# Patient Record
Sex: Male | Born: 1950 | Race: Black or African American | Hispanic: No | Marital: Married | Smoking: Never smoker
Health system: Southern US, Community
[De-identification: ages and names within clinical notes are randomized; demographics above are authoritative.]

## PROBLEM LIST (undated history)

## (undated) DIAGNOSIS — E119 Type 2 diabetes mellitus without complications: Secondary | ICD-10-CM

## (undated) DIAGNOSIS — I10 Essential (primary) hypertension: Secondary | ICD-10-CM

---

## 2017-04-30 ENCOUNTER — Encounter (HOSPITAL_COMMUNITY): Payer: Self-pay

## 2017-04-30 ENCOUNTER — Emergency Department (HOSPITAL_COMMUNITY)
Admission: EM | Admit: 2017-04-30 | Discharge: 2017-04-30 | Disposition: A | Discharge status: Home / Self Care | Payer: Medicare Other | Attending: Emergency Medicine | Admitting: Emergency Medicine

## 2017-04-30 DIAGNOSIS — Y929 Unspecified place or not applicable: Secondary | ICD-10-CM | POA: Diagnosis not present

## 2017-04-30 DIAGNOSIS — Z7984 Long term (current) use of oral hypoglycemic drugs: Secondary | ICD-10-CM | POA: Insufficient documentation

## 2017-04-30 DIAGNOSIS — E119 Type 2 diabetes mellitus without complications: Secondary | ICD-10-CM | POA: Diagnosis not present

## 2017-04-30 DIAGNOSIS — Y939 Activity, unspecified: Secondary | ICD-10-CM | POA: Diagnosis not present

## 2017-04-30 DIAGNOSIS — Z79899 Other long term (current) drug therapy: Secondary | ICD-10-CM | POA: Insufficient documentation

## 2017-04-30 DIAGNOSIS — W57XXXA Bitten or stung by nonvenomous insect and other nonvenomous arthropods, initial encounter: Secondary | ICD-10-CM | POA: Insufficient documentation

## 2017-04-30 DIAGNOSIS — Y999 Unspecified external cause status: Secondary | ICD-10-CM | POA: Insufficient documentation

## 2017-04-30 DIAGNOSIS — S30860A Insect bite (nonvenomous) of lower back and pelvis, initial encounter: Secondary | ICD-10-CM | POA: Diagnosis present

## 2017-04-30 DIAGNOSIS — I10 Essential (primary) hypertension: Secondary | ICD-10-CM | POA: Diagnosis not present

## 2017-04-30 HISTORY — DX: Type 2 diabetes mellitus without complications: E11.9

## 2017-04-30 HISTORY — DX: Essential (primary) hypertension: I10

## 2017-04-30 MED ORDER — DOXYCYCLINE HYCLATE 100 MG PO TABS
200.0000 mg | ORAL_TABLET | Freq: Once | ORAL | Status: AC
  Administered 2017-04-30: 200 mg via ORAL
  Filled 2017-04-30: qty 2

## 2017-04-30 NOTE — ED Triage Notes (Signed)
Pt thinks he has a tick or some insects on his genitals He states that he has random dogs in his yard and thinks that's where they came form

## 2017-04-30 NOTE — ED Notes (Signed)
C/o of two ticks in groin area.

## 2017-04-30 NOTE — ED Provider Notes (Signed)
[facility]-EMERGENCY DEPT Provider Note: Lowella Dell, MD, FACEP  CSN: [id number] MRN: [id number] ARRIVAL: 04/30/17 at 0137 ROOM: WA07/WA07   CHIEF COMPLAINT  Insect Bite   HISTORY OF PRESENT ILLNESS  04/30/17 5:42 AM John Mora is a 66 y.o. male who noticed to bumps on his left groin yesterday while in the shower. He took a closer look and saw that they appeared to be ticks. He applied salt solution with subsequent removal of the takes. He is here now with a mild burning sensation at the site. There is no associated swelling or erythema.    Past Medical History:  Diagnosis Date  . Diabetes mellitus without complication (HCC)   . Hypertension     History reviewed. No pertinent surgical history.  History reviewed. No pertinent family history.  Social History  Substance Use Topics  . Smoking status: Never Smoker  . Smokeless tobacco: Never Used  . Alcohol use No    Prior to Admission medications   Medication Sig Start Date End Date Taking? Authorizing Provider  albuterol (PROVENTIL HFA;VENTOLIN HFA) 108 (90 Base) MCG/ACT inhaler Inhale 2 puffs into the lungs every 6 (six) hours as needed for wheezing or shortness of breath.   Yes [provider]  benazepril (LOTENSIN) 40 MG tablet Take 40 mg by mouth 2 (two) times daily. 04/02/17  Yes [provider]  cholecalciferol (VITAMIN D) 1000 units tablet Take 1,000 Units by mouth daily.   Yes [provider]  furosemide (LASIX) 20 MG tablet Take 20 mg by mouth 2 (two) times daily. 03/07/17  Yes [provider]  glipiZIDE (GLUCOTROL) 5 MG tablet Take 5 mg by mouth 2 (two) times daily.  03/26/17  Yes [provider]  hydroxypropyl methylcellulose / hypromellose (ISOPTO TEARS / GONIOVISC) 2.5 % ophthalmic solution Place 1 drop into both eyes 3 (three) times daily as needed for dry eyes.   Yes [provider]  JANUVIA 100 MG tablet Take 100 mg by mouth daily. 03/15/17  Yes [provider]  LEVEMIR FLEXTOUCH 100 UNIT/ML Pen INJECT 12 UNITS UNDER THE SKIN AT BEDTIME. 02/28/17  Yes [provider]  metFORMIN (GLUCOPHAGE) 500 MG tablet Take 500 mg by mouth 2 (two) times daily with a meal.   Yes [provider]  Omega-3 Fatty Acids (FISH OIL) 1000 MG CAPS Take 1,000 mg by mouth daily.   Yes [provider]  oxyCODONE-acetaminophen (PERCOCET/ROXICET) 5-325 MG tablet TAKE 1 TABLET BY MOUTH FOUR TIMES A DAY AS NEEDED FOR PAIN 04/23/17  Yes [provider]  spironolactone (ALDACTONE) 25 MG tablet Take 25 mg by mouth daily.  04/26/17  Yes [provider]    Allergies Patient has no known allergies.   REVIEW OF SYSTEMS  Negative except as noted here or in the History of Present Illness.   PHYSICAL EXAMINATION  Initial Vital Signs Blood pressure (!) 174/97, pulse 89, temperature 97.7 F (36.5 C), temperature source Oral, resp. rate 16, height  (1.854 m), weight 112.5 kg (248 lb), SpO2 98 %.  Examination General: Well-developed, well-nourished male in no acute distress; appearance consistent with age of record HENT: normocephalic; atraumatic Eyes: normal appearance Neck: supple Heart: regular rate and rhythm Lungs: clear to auscultation bilaterally Abdomen: soft; nondistended; nontender; bowel sounds present GU: Tanner 5 male, circumcised; mild left groin tenderness without lymphadenopathy, swelling, erythema or warmth Extremities: No deformity; full range of motion Neurologic: Awake, alert and oriented; motor function intact in all extremities and symmetric; no  facial droop Skin: Warm and dry Psychiatric: Normal mood and affect   RESULTS  Summary of this visit's results, reviewed by myself:   EKG Interpretation  Date/Time:    Ventricular Rate:    PR Interval:    QRS Duration:   QT Interval:    QTC Calculation:   R Axis:     Text Interpretation:        Laboratory Studies: No results found for this or  any previous visit (from the past 24 hour(s)). Imaging Studies: No results found.  ED COURSE  Nursing notes and initial vitals signs, including pulse oximetry, reviewed.  Vitals:   04/30/17 0237 04/30/17 0351 04/30/17 0548  BP: (!) 174/97  (!) 175/98  Pulse: 89  90  Resp: 16  18  Temp: 97.7 F (36.5 C)  97.8 F (36.6 C)  TempSrc: Oral  Oral  SpO2: 97% 98% 99%  Weight: 112.5 kg (248 lb)    Height:  (1.854 m)     Patient given prophylaxis dose of doxycycline 200 milligrams orally.  PROCEDURES    ED DIAGNOSES     ICD-10-CM   1. Tick bite, initial encounter W57.Neldon Newport, MD 04/30/17 [phone]

## 2017-04-30 NOTE — ED Notes (Signed)
Bed: WA07 Expected date:  Expected time:  Means of arrival:  Comments: 

## 2017-08-31 ENCOUNTER — Emergency Department (HOSPITAL_COMMUNITY)
Admission: EM | Admit: 2017-08-31 | Discharge: 2017-09-01 | Disposition: A | Discharge status: Home / Self Care | Payer: Medicare Other | Attending: Emergency Medicine | Admitting: Emergency Medicine

## 2017-08-31 ENCOUNTER — Emergency Department (HOSPITAL_COMMUNITY): Payer: Medicare Other

## 2017-08-31 DIAGNOSIS — I10 Essential (primary) hypertension: Secondary | ICD-10-CM | POA: Insufficient documentation

## 2017-08-31 DIAGNOSIS — Z79899 Other long term (current) drug therapy: Secondary | ICD-10-CM | POA: Diagnosis not present

## 2017-08-31 DIAGNOSIS — Z7984 Long term (current) use of oral hypoglycemic drugs: Secondary | ICD-10-CM | POA: Insufficient documentation

## 2017-08-31 DIAGNOSIS — J101 Influenza due to other identified influenza virus with other respiratory manifestations: Secondary | ICD-10-CM

## 2017-08-31 DIAGNOSIS — E119 Type 2 diabetes mellitus without complications: Secondary | ICD-10-CM | POA: Insufficient documentation

## 2017-08-31 DIAGNOSIS — R509 Fever, unspecified: Secondary | ICD-10-CM | POA: Diagnosis present

## 2017-08-31 LAB — CBG MONITORING, ED: GLUCOSE-CAPILLARY: 170 mg/dL — AB (ref 65–99)

## 2017-08-31 MED ORDER — ACETAMINOPHEN 325 MG PO TABS
650.0000 mg | ORAL_TABLET | Freq: Once | ORAL | Status: AC | PRN
  Administered 2017-08-31: 650 mg via ORAL
  Filled 2017-08-31: qty 2

## 2017-08-31 MED ORDER — ALBUTEROL SULFATE (2.5 MG/3ML) 0.083% IN NEBU
5.0000 mg | INHALATION_SOLUTION | Freq: Once | RESPIRATORY_TRACT | Status: AC
  Administered 2017-09-01: 5 mg via RESPIRATORY_TRACT
  Filled 2017-08-31: qty 6

## 2017-08-31 NOTE — ED Notes (Signed)
Bed: [id number] Expected date:  Expected time:  Means of arrival:  Comments: EMS 67 yo male flu symptoms

## 2017-08-31 NOTE — ED Triage Notes (Signed)
Patient BIB EMS for flulike symptoms for past few days. Patient diagnosed with the flu today, no N/V. Patient febrile and has generalized weakness. 02 sat 90% on RA

## 2017-08-31 NOTE — ED Provider Notes (Signed)
TIME SEEN: 11:30 PM  CHIEF COMPLAINT: Flulike symptoms  HPI: Patient is a John Mora with history of hypertension, diabetes, hyperlipidemia, cardiomyopathy who presents to the emergency department with 3 days of fever, productive cough, body aches.  Was seen at urgent care today and told that he had a "slightly positive" flu swab.  Was not started on Tamiflu.  Family was concerned because he had an episode today where he was very weak and unable to answer questions or follow commands for them for about 30 minutes.  States that this was about 6 PM.  He states he remembers this episode but states he just felt so bad he could not do anything.  He denies true a aphasia, numbness, focal weakness, headache, neck pain or neck stiffness.  Was given and azithromycin from urgent care for possible pneumonia as well.  No other new medications.  No chest pain or shortness of breath.  ROS: See HPI Constitutional:  fever  Eyes: no drainage  ENT:  runny nose   Cardiovascular:  no chest pain  Resp: no SOB  GI: no vomiting GU: no dysuria Integumentary: no rash  Allergy: no hives  Musculoskeletal: no leg swelling  Neurological: no slurred speech ROS otherwise negative  PAST MEDICAL HISTORY/PAST SURGICAL HISTORY:  Past Medical History:  Diagnosis Date  . Diabetes mellitus without complication (HCC)   . Hypertension     MEDICATIONS:  Prior to Admission medications   Medication Sig Start Date End Date Taking? Authorizing Provider  allopurinol (ZYLOPRIM) 100 MG tablet Take 200 mg by mouth daily.   Yes [provider]  aspirin EC 81 MG tablet Take 81 mg by mouth daily.   Yes [provider]  azithromycin (ZITHROMAX) 250 MG tablet Take 250 mg by mouth once.  08/31/17  Yes [provider]  benazepril (LOTENSIN) 40 MG tablet Take 40 mg by mouth 2 (two) times daily. 04/02/17  Yes [provider]  cholecalciferol (VITAMIN D) 1000 units tablet Take 1,000 Units by mouth  daily.   Yes [provider]  furosemide (LASIX) 20 MG tablet Take 60 mg by mouth 2 (two) times daily.  03/07/17  Yes [provider]  glipiZIDE (GLUCOTROL) 5 MG tablet Take 15 mg by mouth 2 (two) times daily.  03/26/17  Yes [provider]  JANUVIA 100 MG tablet Take 100 mg by mouth daily. 03/15/17  Yes [provider]  LEVEMIR FLEXTOUCH 100 UNIT/ML Pen INJECT 12 UNITS UNDER THE SKIN AT BEDTIME. 02/28/17  Yes [provider]  metFORMIN (GLUCOPHAGE) 850 MG tablet Take 850 mg by mouth 3 (three) times daily.   Yes [provider]  metoprolol tartrate (LOPRESSOR) 100 MG tablet Take 100 mg by mouth 2 (two) times daily.   Yes [provider]  Omega-3 Fatty Acids (FISH OIL) 1000 MG CAPS Take 1,000 mg by mouth daily.   Yes [provider]  ONGLYZA 5 MG TABS tablet Take 5 mg by mouth daily.  08/24/17  Yes [provider]  oxyCODONE-acetaminophen (PERCOCET/ROXICET) 5-325 MG tablet TAKE 1 TABLET BY MOUTH FOUR TIMES A DAY AS NEEDED FOR PAIN 04/23/17  Yes [provider]  sennosides-docusate sodium (SENOKOT-S) 8.6-50 MG tablet Take 2 tablets by mouth 2 (two) times daily.   Yes [provider]  spironolactone (ALDACTONE) 25 MG tablet Take 25 mg by mouth daily.  04/26/17  Yes [provider]  terazosin (HYTRIN) 10 MG capsule Take 10 mg by mouth at bedtime.   Yes [provider]  albuterol (PROVENTIL HFA;VENTOLIN HFA) 108 (90 Base) MCG/ACT inhaler Inhale 2 puffs into the lungs every 6 (six) hours as needed for wheezing or shortness of breath.    [provider]  benzonatate (TESSALON) 100 MG capsule Take 100 mg by mouth 3 (three) times daily as needed for cough.  08/31/17   [provider]  hydroxypropyl methylcellulose / hypromellose (ISOPTO TEARS / GONIOVISC) 2.5 % ophthalmic solution Place 1 drop into both eyes 3 (three) times daily as needed for dry eyes.    [provider]   metFORMIN (GLUCOPHAGE) 500 MG tablet Take 500 mg by mouth 2 (two) times daily with a meal.    [provider]  mupirocin ointment (BACTROBAN) 2 % APPLY TO AFFECTED AREA EVERY DAY AS NEEDED 06/13/17   [provider]    ALLERGIES:  No Known Allergies  SOCIAL HISTORY:  Social History   Tobacco Use  . Smoking status: Never Smoker  . Smokeless tobacco: Never Used  Substance Use Topics  . Alcohol use: No    FAMILY HISTORY: No family history on file.  EXAM: BP (!) 141/77   Pulse 96   Temp (!) 102.9 F (39.4 C) (Oral)   Resp 18   SpO2 98%  CONSTITUTIONAL: Alert and oriented x3 and responds appropriately to questions. Well-appearing; well-nourished, obese, febrile but nontoxic HEAD: Normocephalic EYES: Conjunctivae clear, pupils appear equal, EOMI ENT: normal nose; moist mucous membranes; No pharyngeal erythema or petechiae, no tonsillar hypertrophy or exudate, no uvular deviation, no unilateral swelling, no trismus or drooling, no muffled voice, normal phonation, no stridor, no dental caries present, no drainable dental abscess noted, no Ludwig's angina, tongue sits flat in the bottom of the mouth, no angioedema, no facial erythema or warmth, no facial swelling; no pain with movement of the neck. NECK: Supple, no meningismus, no nuchal rigidity, no LAD  CARD: RRR; S1 and S2 appreciated; no murmurs, no clicks, no rubs, no gallops RESP: Normal chest excursion without splinting or tachypnea; breath sounds clear and equal bilaterally; no wheezes, no rhonchi, no rales, no hypoxia or respiratory distress, speaking full sentences ABD/GI: Normal bowel sounds; non-distended; soft, non-tender, no rebound, no guarding, no peritoneal signs, no hepatosplenomegaly BACK:  The back appears normal and is non-tender to palpation, there is no CVA tenderness EXT: Normal ROM in all joints; non-tender to palpation; no edema; normal capillary refill; no cyanosis, no calf tenderness or  swelling    SKIN: Normal color for age and race; warm; no rash NEURO: Moves all extremities equally, no pronator drift, sensation to light touch intact diffusely, cranial nerves II through XII intact, normal speech PSYCH: The patient's mood and manner are appropriate. Grooming and personal hygiene are appropriate.  MEDICAL DECISION MAKING: Patient here with episode of generalized weakness and not responding or talking to family.  He states he was awakened during this episode and remembers everything that happened but just states he felt so bad that he could not respond or interact.  Does not to like a seizure or syncope.  I doubt that this was a stroke.  He is neurologically intact now.  Will check labs, urine today.  We will also obtain flu swab.  Chest x-ray shows no infiltrate.  Will give breathing treatment for symptomatic relief.  Received Tylenol in the waiting room.  Blood glucose is normal.  ED PROGRESS: Patient's labs are reassuring.  Urine shows no sign of infection or significant dehydration.  Chest x-ray clear.  Lactate normal.  Patient is flu positive.  Will start him on Tamiflu.  Still neurologically intact.  Able to ambulate without difficulty and no oxygen desaturation.  I feel he is safe for discharge home.  Discussed supportive care instructions including alternating Tylenol and ibuprofen, rest, increase fluid intake.  Patient and family are comfortable with this plan.   At this time, I do not feel there is any life-threatening condition present. I have reviewed and discussed all results (EKG, imaging, lab, urine as appropriate) and exam findings with patient/family. I have reviewed nursing notes and appropriate previous records.  I feel the patient is safe to be discharged home without further emergent workup and can continue workup as an outpatient as needed. Discussed usual and customary return precautions. Patient/family verbalize understanding and are comfortable with this plan.   Outpatient follow-up has been provided if needed. All questions have been answered.      Ward, Layla Maw, DO 09/01/17 [phone]

## 2017-09-01 DIAGNOSIS — J101 Influenza due to other identified influenza virus with other respiratory manifestations: Secondary | ICD-10-CM | POA: Diagnosis not present

## 2017-09-01 LAB — CBC WITH DIFFERENTIAL/PLATELET
Basophils Absolute: 0 10*3/uL (ref 0.0–0.1)
Basophils Relative: 0 %
EOS ABS: 0.2 10*3/uL (ref 0.0–0.7)
EOS PCT: 3 %
HCT: 34.4 % — ABNORMAL LOW (ref 39.0–52.0)
Hemoglobin: 11.5 g/dL — ABNORMAL LOW (ref 13.0–17.0)
LYMPHS ABS: 0.5 10*3/uL — AB (ref 0.7–4.0)
Lymphocytes Relative: 9 %
MCH: 28.7 pg (ref 26.0–34.0)
MCHC: 33.4 g/dL (ref 30.0–36.0)
MCV: 85.8 fL (ref 78.0–100.0)
MONO ABS: 0.6 10*3/uL (ref 0.1–1.0)
MONOS PCT: 11 %
Neutro Abs: 4.3 10*3/uL (ref 1.7–7.7)
Neutrophils Relative %: 77 %
PLATELETS: 174 10*3/uL (ref 150–400)
RBC: 4.01 MIL/uL — ABNORMAL LOW (ref 4.22–5.81)
RDW: 13.4 % (ref 11.5–15.5)
WBC: 5.6 10*3/uL (ref 4.0–10.5)

## 2017-09-01 LAB — INFLUENZA PANEL BY PCR (TYPE A & B)
INFLAPCR: POSITIVE — AB
INFLBPCR: NEGATIVE

## 2017-09-01 LAB — COMPREHENSIVE METABOLIC PANEL
ALT: 13 U/L — ABNORMAL LOW (ref 17–63)
ANION GAP: 7 (ref 5–15)
AST: 20 U/L (ref 15–41)
Albumin: 3.4 g/dL — ABNORMAL LOW (ref 3.5–5.0)
Alkaline Phosphatase: 65 U/L (ref 38–126)
BUN: 12 mg/dL (ref 6–20)
CALCIUM: 8.9 mg/dL (ref 8.9–10.3)
CHLORIDE: 106 mmol/L (ref 101–111)
CO2: 27 mmol/L (ref 22–32)
Creatinine, Ser: 1.23 mg/dL (ref 0.61–1.24)
GFR, EST NON AFRICAN AMERICAN: 59 mL/min — AB (ref >60)
Glucose, Bld: 187 mg/dL — ABNORMAL HIGH (ref 65–99)
Potassium: 3.3 mmol/L — ABNORMAL LOW (ref 3.5–5.1)
SODIUM: 140 mmol/L (ref 135–145)
Total Bilirubin: 0.9 mg/dL (ref 0.3–1.2)
Total Protein: 6.4 g/dL — ABNORMAL LOW (ref 6.5–8.1)

## 2017-09-01 LAB — URINALYSIS, ROUTINE W REFLEX MICROSCOPIC
BACTERIA UA: NONE SEEN
BILIRUBIN URINE: NEGATIVE
Glucose, UA: NEGATIVE mg/dL
Hgb urine dipstick: NEGATIVE
Ketones, ur: 5 mg/dL — AB
LEUKOCYTES UA: NEGATIVE
Nitrite: NEGATIVE
PH: 6 (ref 5.0–8.0)
Protein, ur: 30 mg/dL — AB
SPECIFIC GRAVITY, URINE: 1.019 (ref 1.005–1.030)
SQUAMOUS EPITHELIAL / LPF: NONE SEEN

## 2017-09-01 LAB — I-STAT CG4 LACTIC ACID, ED: LACTIC ACID, VENOUS: 0.92 mmol/L (ref 0.5–1.9)

## 2017-09-01 MED ORDER — ACETAMINOPHEN 500 MG PO TABS
1000.0000 mg | ORAL_TABLET | Freq: Once | ORAL | Status: AC
  Administered 2017-09-01: 1000 mg via ORAL
  Filled 2017-09-01: qty 2

## 2017-09-01 MED ORDER — OSELTAMIVIR PHOSPHATE 75 MG PO CAPS
75.0000 mg | ORAL_CAPSULE | Freq: Once | ORAL | Status: AC
Start: 2017-09-01 — End: 2017-09-01
  Administered 2017-09-01: 75 mg via ORAL
  Filled 2017-09-01: qty 1

## 2017-09-01 MED ORDER — OSELTAMIVIR PHOSPHATE 75 MG PO CAPS: 75.0000 mg | ORAL_CAPSULE | Freq: Two times a day (BID) | ORAL | 0 refills | Status: AC

## 2017-09-01 NOTE — Discharge Instructions (Signed)
You may alternate Tylenol 1000 mg every 6 hours as needed for fever and pain and ibuprofen 800 mg every 8 hours as needed for fever and pain. Please rest and drink plenty of fluids. This is a viral illness causing your symptoms. You do not need antibiotics for a virus. You may use over-the-counter nasal saline spray and Afrin nasal saline spray as needed for nasal congestion. Please do not use Afrin for more than 3 days in a row. You may use Mucinex and Dextromethorphan as needed for cough.  You may use lozenges and Chloraseptic spray to help with sore throat.  Warm salt water gargles can also help with sore throat.  You may use over-the-counter Unisom (doxyalamine) or Benadryl to help with sleep.  Please note that some combination medicines such as DayQuil and NyQuil have multiple medications in them.  Please make sure you look at all labels to ensure that you are not taking too much of any one particular medication.  Symptoms from a virus may take 7-14 days to run its course.  We do not test for the flu from the emergency department as we do not have rapid flu swabs and it takes hours for this test to come back and it would not change our management. The flu is treated like any other virus with supportive measures as listed above.    I do not think that you need to take azithromycin.  You do not have pneumonia.

## 2017-09-01 NOTE — ED Notes (Addendum)
Patient tolerated ambulating in hallway. Patient's O2 saturation remained 92-94% on RA while ambulating. Patient did not appear short of breath.

## 2017-09-06 LAB — CULTURE, BLOOD (ROUTINE X 2)
CULTURE: NO GROWTH
Culture: NO GROWTH
SPECIAL REQUESTS: ADEQUATE

## 2018-02-22 ENCOUNTER — Other Ambulatory Visit: Payer: Self-pay | Admitting: Nephrology

## 2018-02-22 DIAGNOSIS — N189 Chronic kidney disease, unspecified: Principal | ICD-10-CM

## 2018-02-22 DIAGNOSIS — N179 Acute kidney failure, unspecified: Secondary | ICD-10-CM

## 2018-03-04 ENCOUNTER — Ambulatory Visit
Admission: RE | Admit: 2018-03-04 | Discharge: 2018-03-04 | Disposition: A | Discharge status: Home / Self Care | Payer: Medicare Other | Source: Ambulatory Visit | Attending: Nephrology | Admitting: Nephrology

## 2018-03-04 DIAGNOSIS — N189 Chronic kidney disease, unspecified: Principal | ICD-10-CM

## 2018-03-04 DIAGNOSIS — N179 Acute kidney failure, unspecified: Secondary | ICD-10-CM

## 2018-06-15 ENCOUNTER — Encounter: Payer: Self-pay | Admitting: Physician Assistant

## 2019-06-24 IMAGING — US US RENAL
1 series · 13 of 25 positions shown · non-contrast
Comparison: None.

CLINICAL DATA: 66-year-old male with a history of acute kidney
injury

EXAM:
RENAL / URINARY TRACT ULTRASOUND COMPLETE

[Series 1: us renal · 0.26mm/px · 13 of 48 slices shown]
[im 1/48]
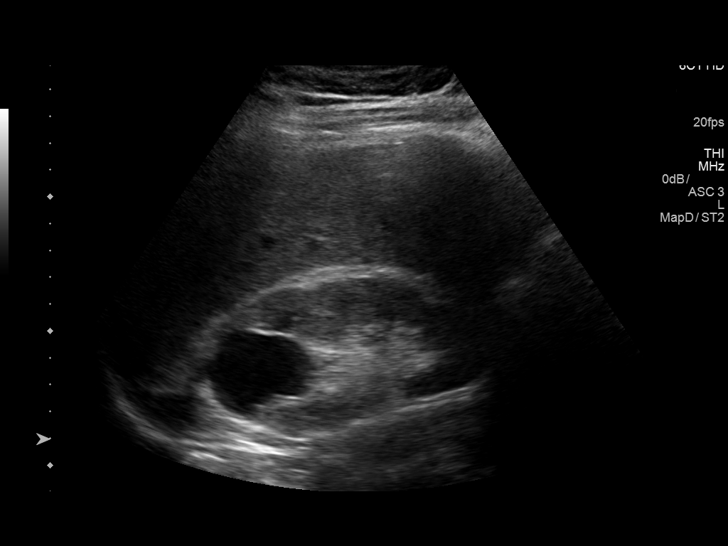
[im 4/48]
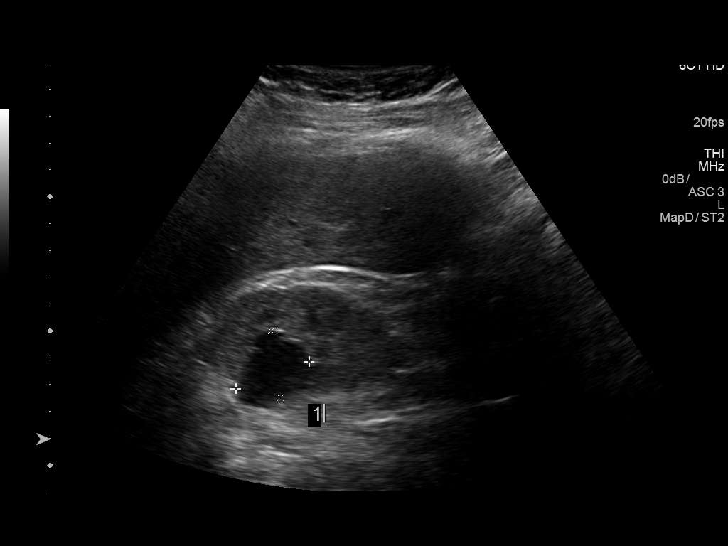
[im 8/48]
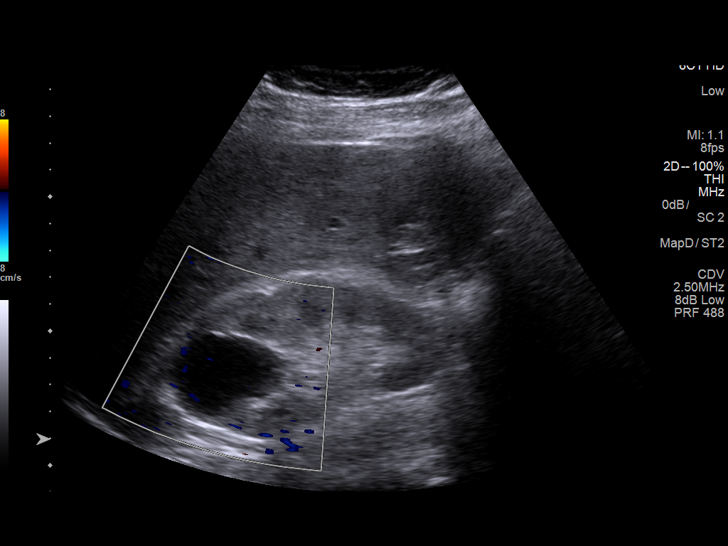
[im 12/48]
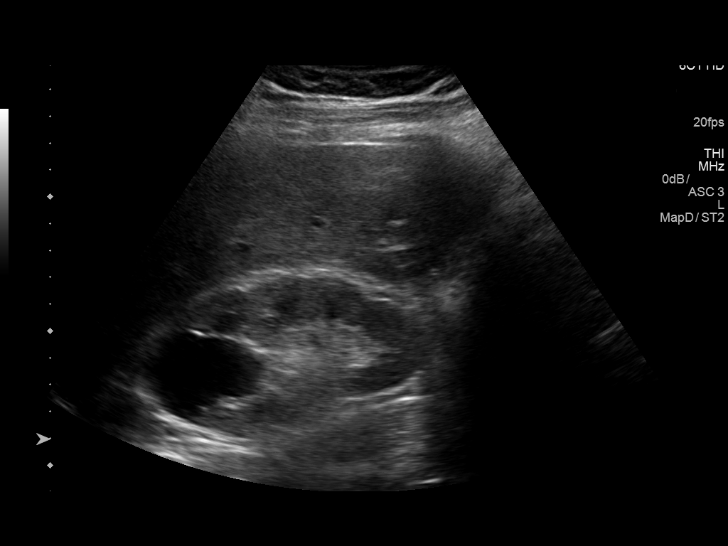
[im 16/48]
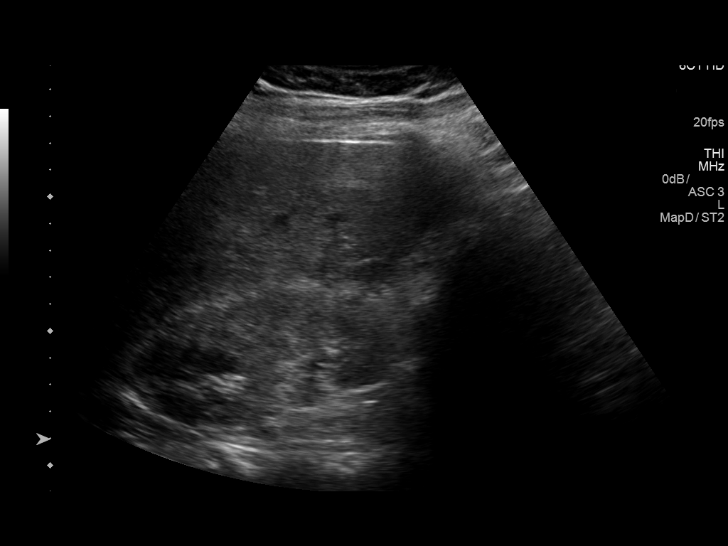
[im 20/48]
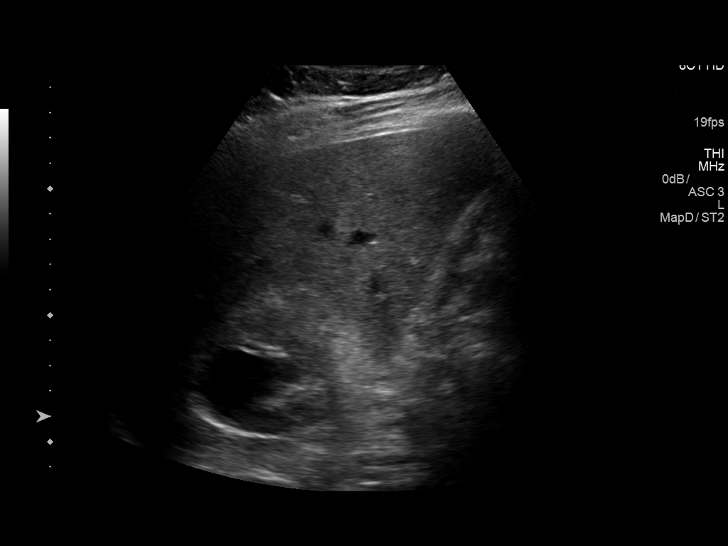
[im 24/48]
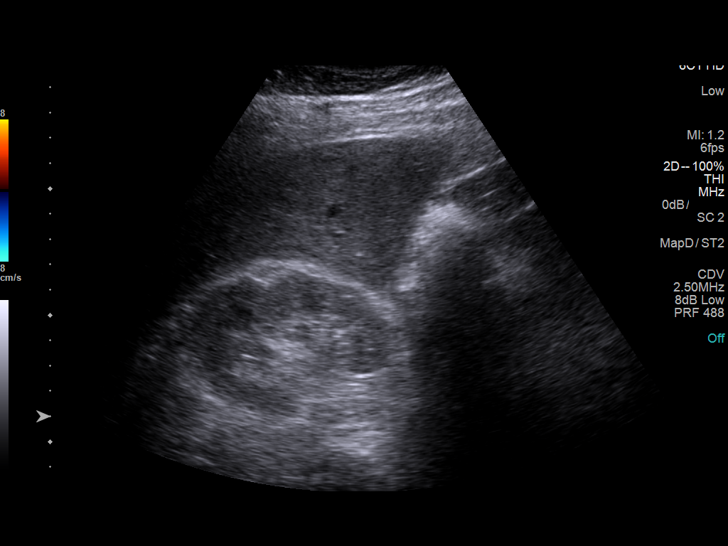
[im 28/48]
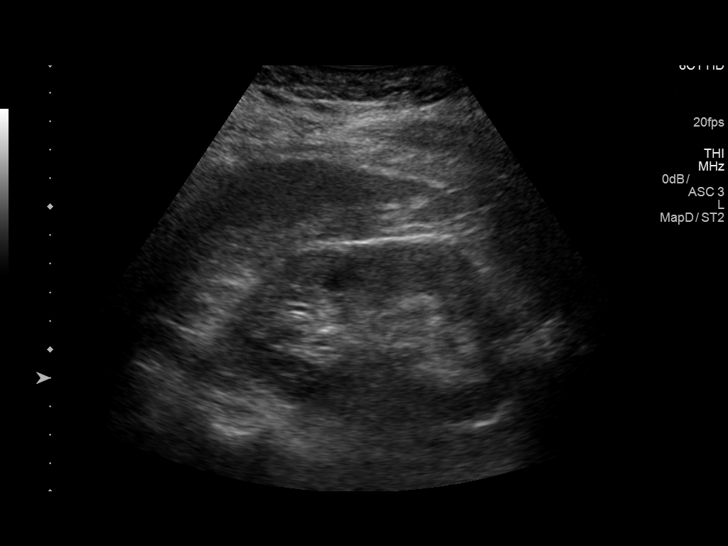
[im 32/48]
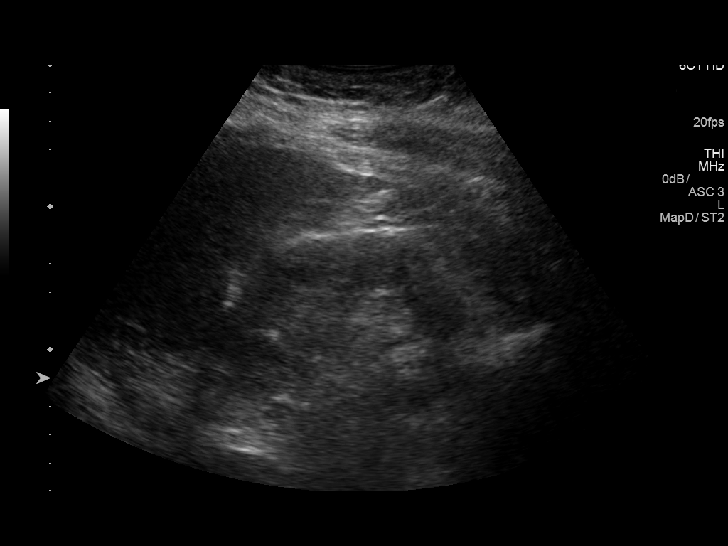
[im 36/48]
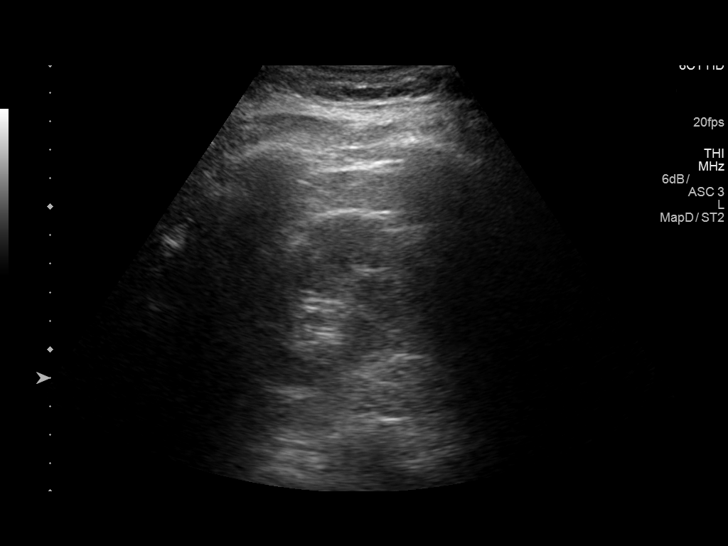
[im 40/48]
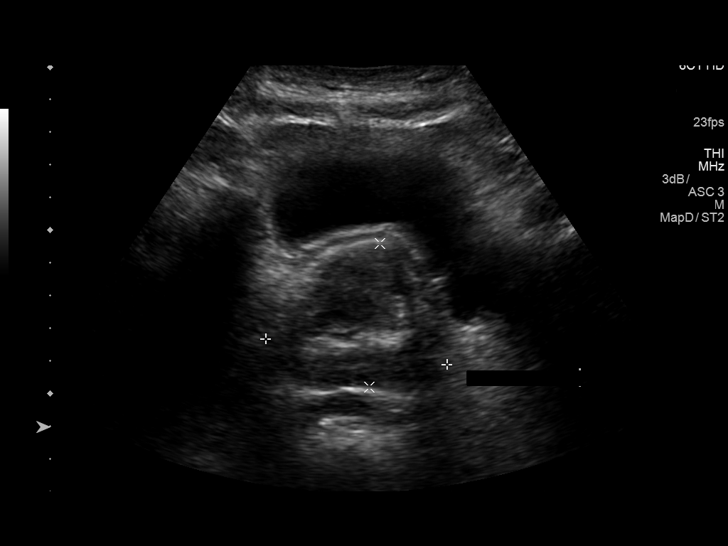
[im 44/48]
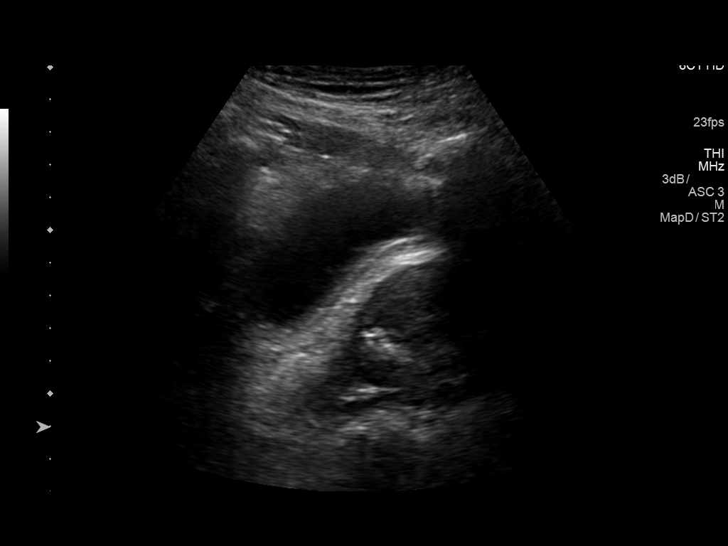
[im 48/48]
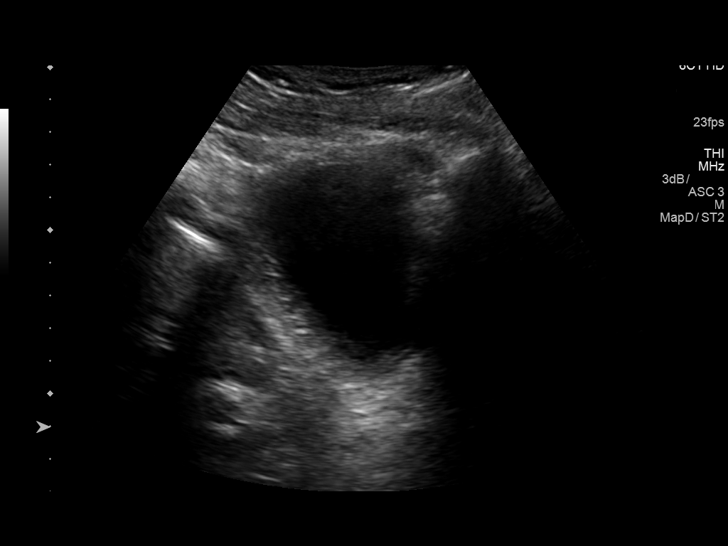

[13 of 25 positions shown; findings below may reference images not displayed]

FINDINGS: Right Kidney:

Length: 11.5 cm. Echogenicity of the right kidney is slightly
increased compared to that of the adjacent liver parenchyma. No
hydronephrosis. Flow confirmed in the hilum of the right kidney.

Cystic structure at the superior right kidney with no internal
complexity and no internal flow. This lesion measures 2.9 cm x
cm x 3.8 cm.

Second lesion of the right kidney measures 4.3 cm x 3.4 cm x 2.4 cm.
This lesion is anechoic with through transmission and no internal
complexity.

Left Kidney:

Length: 10.6 cm. Echogenicity of the left kidney symmetric to the
right slightly increased. No hydronephrosis. Flow confirmed in the
hilum of the left kidney

Bladder:

Appears normal for degree of bladder distention.

Likely enlarged prostate size (largest diameter 5.6 cm) with
impression on the bladder base.
IMPRESSION: Echogenicity of the bilateral kidneys is increased suggesting
medical renal disease.

No evidence of hydronephrosis.

There are 2 cystic lesions of the right kidney compatible with
Bosniak 1 cysts.

Prostatomegaly.

## 2020-02-27 ENCOUNTER — Other Ambulatory Visit (HOSPITAL_COMMUNITY): Payer: Federal, State, Local not specified - PPO

## 2020-02-27 ENCOUNTER — Encounter (HOSPITAL_COMMUNITY): Payer: Federal, State, Local not specified - PPO

## 2020-02-27 ENCOUNTER — Encounter: Payer: Federal, State, Local not specified - PPO | Admitting: Vascular Surgery

## 2023-10-19 ENCOUNTER — Other Ambulatory Visit: Payer: Self-pay | Admitting: Family Medicine

## 2023-10-19 DIAGNOSIS — E049 Nontoxic goiter, unspecified: Secondary | ICD-10-CM
